# Patient Record
Sex: Male | Born: 1945 | Race: White | Hispanic: No | Marital: Single | Smoking: Current every day smoker
Health system: Southern US, Community
[De-identification: ages and names within clinical notes are randomized; demographics above are authoritative.]

## PROBLEM LIST (undated history)

## (undated) HISTORY — PX: NO PAST SURGERIES: SHX2092

---

## 2013-12-18 DIAGNOSIS — Z23 Encounter for immunization: Secondary | ICD-10-CM | POA: Diagnosis not present

## 2015-07-29 ENCOUNTER — Ambulatory Visit
Admission: EM | Admit: 2015-07-29 | Discharge: 2015-07-29 | Disposition: A | Discharge status: Home / Self Care | Payer: Medicare Other | Attending: Family Medicine | Admitting: Family Medicine

## 2015-07-29 DIAGNOSIS — J0100 Acute maxillary sinusitis, unspecified: Secondary | ICD-10-CM | POA: Diagnosis not present

## 2015-07-29 DIAGNOSIS — R062 Wheezing: Secondary | ICD-10-CM | POA: Diagnosis not present

## 2015-07-29 DIAGNOSIS — J42 Unspecified chronic bronchitis: Secondary | ICD-10-CM

## 2015-07-29 MED ORDER — ALBUTEROL SULFATE HFA 108 (90 BASE) MCG/ACT IN AERS: 2.0000 | INHALATION_SPRAY | Freq: Four times a day (QID) | RESPIRATORY_TRACT | Status: AC | PRN

## 2015-07-29 MED ORDER — DOXYCYCLINE HYCLATE 100 MG PO TABS: 100.0000 mg | ORAL_TABLET | Freq: Two times a day (BID) | ORAL | Status: AC

## 2015-07-29 MED ORDER — PREDNISONE 20 MG PO TABS: 20.0000 mg | ORAL_TABLET | Freq: Every day | ORAL | Status: AC

## 2015-07-29 NOTE — ED Notes (Signed)
Patient complains of sinus pain and pressure, with a cough. Patient states that he is a current smoker and is wheezing today. Patient states that symptoms started a week ago. Patient states that he is worse in the morning. Patient states that he is not currently on any inhalers.

## 2015-07-29 NOTE — ED Provider Notes (Signed)
CSN: [id number]     Arrival date & time 07/29/15  1230 History   First MD Initiated Contact with Patient 07/29/15 1252     Chief Complaint  Patient presents with  . Sinusitis   (Consider location/radiation/quality/duration/timing/severity/associated sxs/prior Treatment) Patient is a 70 y.o. male presenting with URI. The history is provided by the patient.  URI Presenting symptoms: congestion, cough and facial pain   Presenting symptoms: no fever   Severity:  Moderate Onset quality:  Sudden Duration:  2 weeks Timing:  Constant Progression:  Worsening Chronicity:  New Relieved by:  Nothing Ineffective treatments:  OTC medications Associated symptoms: headaches, sinus pain and wheezing   Risk factors: chronic respiratory disease (likely copd/emphysema due to >40 pack year h/o cigarrette smoking)     History reviewed. No pertinent past medical history. Past Surgical History  Procedure Laterality Date  . No past surgeries     Family History  Problem Relation Age of Onset  . Alzheimer's disease Father   . Emphysema Mother    Social History  Substance Use Topics  . Smoking status: Current Every Day Smoker -- 1.00 packs/day for 47 years    Types: Cigarettes  . Smokeless tobacco: None  . Alcohol Use: 0.0 oz/week    0 Standard drinks or equivalent per week     Comment: Drinks 6 beers daily, previously used to drink 12 beers daily    Review of Systems  Constitutional: Negative for fever.  HENT: Positive for congestion.   Respiratory: Positive for cough and wheezing.   Neurological: Positive for headaches.    Allergies  Review of patient's allergies indicates no known allergies.  Home Medications   Prior to Admission medications   Medication Sig Start Date End Date Taking? Authorizing Provider  albuterol (PROVENTIL HFA;VENTOLIN HFA) 108 (90 Base) MCG/ACT inhaler Inhale 2 puffs into the lungs every 6 (six) hours as needed for wheezing or shortness of breath. 07/29/15    Payton Mccallum, MD  doxycycline (VIBRA-TABS) 100 MG tablet Take 1 tablet (100 mg total) by mouth 2 (two) times daily. 07/29/15   Payton Mccallum, MD  predniSONE (DELTASONE) 20 MG tablet Take 1 tablet (20 mg total) by mouth daily. 07/29/15   Payton Mccallum, MD   Meds Ordered and Administered this Visit  Medications - No data to display  BP 115/78 mmHg  Pulse 88  Temp(Src) 98.2 F (36.8 C) (Oral)  Resp 16  Ht  (1.676 m)  Wt 105 lb (47.628 kg)  BMI 16.96 kg/m2  SpO2 98% No data found.   Physical Exam  Constitutional: He appears well-developed and well-nourished. No distress.  HENT:  Head: Normocephalic and atraumatic.  Right Ear: Tympanic membrane, external ear and ear canal normal.  Left Ear: Tympanic membrane, external ear and ear canal normal.  Nose: Right sinus exhibits maxillary sinus tenderness and frontal sinus tenderness. Left sinus exhibits maxillary sinus tenderness and frontal sinus tenderness.  Mouth/Throat: Uvula is midline, oropharynx is clear and moist and mucous membranes are normal. No oropharyngeal exudate or tonsillar abscesses.  Eyes: Conjunctivae and EOM are normal. Pupils are equal, round, and reactive to light. Right eye exhibits no discharge. Left eye exhibits no discharge. No scleral icterus.  Neck: Normal range of motion. Neck supple. No tracheal deviation present. No thyromegaly present.  Cardiovascular: Normal rate, regular rhythm and normal heart sounds.   Pulmonary/Chest: Effort normal and breath sounds normal. No stridor. No respiratory distress. He has no wheezes. He has no rales. He exhibits no tenderness.  Lymphadenopathy:    He has no cervical adenopathy.  Neurological: He is alert.  Skin: Skin is warm and dry. No rash noted. He is not diaphoretic.  Nursing note and vitals reviewed.   ED Course  Procedures (including critical care time)  Labs Review Labs Reviewed - No data to display  Imaging Review No results found.   Visual Acuity  Review  Right Eye Distance:   Left Eye Distance:   Bilateral Distance:    Right Eye Near:   Left Eye Near:    Bilateral Near:         MDM   1. Acute maxillary sinusitis, recurrence not specified   2. Chronic bronchitis, unspecified chronic bronchitis type (HCC)   3. Wheezing     Discharge Medication List as of 07/29/2015  1:20 PM    START taking these medications   Details  albuterol (PROVENTIL HFA;VENTOLIN HFA) 108 (90 Base) MCG/ACT inhaler Inhale 2 puffs into the lungs every 6 (six) hours as needed for wheezing or shortness of breath., Starting 07/29/2015, Until Discontinued, Normal    doxycycline (VIBRA-TABS) 100 MG tablet Take 1 tablet (100 mg total) by mouth 2 (two) times daily., Starting 07/29/2015, Until Discontinued, Normal    predniSONE (DELTASONE) 20 MG tablet Take 1 tablet (20 mg total) by mouth daily., Starting 07/29/2015, Until Discontinued, Normal       1. diagnosis reviewed with patient 2. rx as per orders above; reviewed possible side effects, interactions, risks and benefits  3. Recommend supportive treatment with otc decongestant prn 4. Recommend establish care with a PCP (states has not seen a PCP in about 3 years; info given) 5. Follow-up prn if symptoms worsen or don't improve   Payton Mccallumrlando Lekeisha Arenas, MD 07/29/15 1328

## 2015-07-29 NOTE — Discharge Instructions (Signed)
Chronic Bronchitis Chronic bronchitis is a lasting inflammation of the bronchial tubes, which are the tubes that carry air into your lungs. This is inflammation that occurs:   On most days of the week.   For at least three months at a time.   Over a period of two years in a row. When the bronchial tubes are inflamed, they start to produce mucus. The inflammation and buildup of mucus make it more difficult to breathe. Chronic bronchitis is usually a permanent problem and is one type of chronic obstructive pulmonary disease (COPD). People with chronic bronchitis are at greater risk for getting repeated colds, or respiratory infections. CAUSES  Chronic bronchitis most often occurs in people who have:  Long-standing, severe asthma.  A history of smoking.  Asthma and who also smoke. SIGNS AND SYMPTOMS  Chronic bronchitis may cause the following:   A cough that brings up mucus (productive cough).  Shortness of breath.  Early morning headache.  Wheezing.  Chest discomfort.   Recurring respiratory infections. DIAGNOSIS  Your health care provider may confirm the diagnosis by:  Taking your medical history.  Performing a physical exam.  Taking a chest X-ray.   Performing pulmonary function tests. TREATMENT  Treatment involves controlling symptoms with medicines, oxygen therapy, or making lifestyle changes, such as exercising and eating a healthy, well-balanced diet. Medicines could include:  Inhalers to improve air flow in and out of your lungs.  Antibiotics to treat bacterial infections, such as pneumonia, sinus infections, and acute bronchitis. As a preventative measure, your health care provider may recommend routine vaccinations for influenza and pneumonia. This is to prevent infection and hospitalization since you may be more at risk for these types of infections.  HOME CARE INSTRUCTIONS  Take medicines only as directed by your health care provider.   If you smoke  cigarettes, chew tobacco, or use electronic cigarettes, quit. If you need help quitting, ask your health care provider.  Avoid pollen, dust, animal dander, molds, smoke, and other things that cause shortness of breath or wheezing attacks.  Talk to your health care provider about possible exercise routines. Regular exercise is very important to help you feel better.  If you are prescribed oxygen use at home follow these guidelines:  Never smoke while using oxygen. Oxygen does not burn or explode, but flammable materials will burn faster in the presence of oxygen.  Keep a fire extinguisher close by. Let your fire department know that you have oxygen in your home.  Warn visitors not to smoke near you when you are using oxygen. Put up "no smoking" signs in your home where you most often use the oxygen.  Regularly test your smoke detectors at home to make sure they work. If you receive care in your home from a nurse or other health care provider, he or she may also check to make sure your smoke detectors work.  Ask your health care provider whether you would benefit from a pulmonary rehabilitation program.  Do not wait to get medical care if you have any concerning symptoms. Delays could cause permanent injury and may be life threatening. SEEK MEDICAL CARE IF:  You have increased coughing or shortness of breath or both.  You have muscle aches.  You have chest pain.  Your mucus gets thicker.  Your mucus changes from clear or white to yellow, green, gray, or bloody. SEEK IMMEDIATE MEDICAL CARE IF:  Your usual medicines do not stop your wheezing.   You have increased difficulty breathing.     You have any problems with the medicine you are taking, such as a rash, itching, swelling, or trouble breathing. MAKE SURE YOU:   Understand these instructions.  Will watch your condition.  Will get help right away if you are not doing well or get worse.   This information is not intended to  replace advice given to you by your health care provider. Make sure you discuss any questions you have with your health care provider.   Document Released: 11/13/2005 Document Revised: 02/16/2014 Document Reviewed: 03/06/2013 Elsevier Interactive Patient Education 2016 [employer]  

## 2016-01-09 DIAGNOSIS — Z23 Encounter for immunization: Secondary | ICD-10-CM | POA: Diagnosis not present

## 2016-09-08 ENCOUNTER — Emergency Department: Payer: Medicare Other

## 2016-09-08 ENCOUNTER — Encounter: Payer: Self-pay | Admitting: Emergency Medicine

## 2016-09-08 ENCOUNTER — Emergency Department
Admission: EM | Admit: 2016-09-08 | Discharge: 2016-09-08 | Disposition: A | Discharge status: Home / Self Care | Payer: Medicare Other | Attending: Emergency Medicine | Admitting: Emergency Medicine

## 2016-09-08 DIAGNOSIS — Y92012 Bathroom of single-family (private) house as the place of occurrence of the external cause: Secondary | ICD-10-CM | POA: Diagnosis not present

## 2016-09-08 DIAGNOSIS — F10229 Alcohol dependence with intoxication, unspecified: Secondary | ICD-10-CM | POA: Diagnosis not present

## 2016-09-08 DIAGNOSIS — Z79899 Other long term (current) drug therapy: Secondary | ICD-10-CM | POA: Insufficient documentation

## 2016-09-08 DIAGNOSIS — Y998 Other external cause status: Secondary | ICD-10-CM | POA: Insufficient documentation

## 2016-09-08 DIAGNOSIS — I714 Abdominal aortic aneurysm, without rupture, unspecified: Secondary | ICD-10-CM

## 2016-09-08 DIAGNOSIS — S22000A Wedge compression fracture of unspecified thoracic vertebra, initial encounter for closed fracture: Secondary | ICD-10-CM | POA: Insufficient documentation

## 2016-09-08 DIAGNOSIS — S299XXA Unspecified injury of thorax, initial encounter: Secondary | ICD-10-CM | POA: Diagnosis not present

## 2016-09-08 DIAGNOSIS — Z23 Encounter for immunization: Secondary | ICD-10-CM | POA: Diagnosis not present

## 2016-09-08 DIAGNOSIS — S0990XA Unspecified injury of head, initial encounter: Secondary | ICD-10-CM | POA: Diagnosis not present

## 2016-09-08 DIAGNOSIS — E871 Hypo-osmolality and hyponatremia: Secondary | ICD-10-CM | POA: Diagnosis not present

## 2016-09-08 DIAGNOSIS — F17210 Nicotine dependence, cigarettes, uncomplicated: Secondary | ICD-10-CM | POA: Insufficient documentation

## 2016-09-08 DIAGNOSIS — R51 Headache: Secondary | ICD-10-CM | POA: Diagnosis not present

## 2016-09-08 DIAGNOSIS — M545 Low back pain: Secondary | ICD-10-CM | POA: Diagnosis not present

## 2016-09-08 DIAGNOSIS — S3992XA Unspecified injury of lower back, initial encounter: Secondary | ICD-10-CM | POA: Diagnosis not present

## 2016-09-08 DIAGNOSIS — W19XXXA Unspecified fall, initial encounter: Secondary | ICD-10-CM | POA: Diagnosis not present

## 2016-09-08 DIAGNOSIS — Y9301 Activity, walking, marching and hiking: Secondary | ICD-10-CM | POA: Insufficient documentation

## 2016-09-08 DIAGNOSIS — F1020 Alcohol dependence, uncomplicated: Secondary | ICD-10-CM

## 2016-09-08 DIAGNOSIS — W01198A Fall on same level from slipping, tripping and stumbling with subsequent striking against other object, initial encounter: Secondary | ICD-10-CM | POA: Diagnosis not present

## 2016-09-08 LAB — CBC WITH DIFFERENTIAL/PLATELET
Basophils Absolute: 0.1 10*3/uL (ref 0–0.1)
Basophils Relative: 1 %
EOS PCT: 1 %
Eosinophils Absolute: 0.1 10*3/uL (ref 0–0.7)
HEMATOCRIT: 36.9 % — AB (ref 40.0–52.0)
Hemoglobin: 12.9 g/dL — ABNORMAL LOW (ref 13.0–18.0)
LYMPHS PCT: 11 %
Lymphs Abs: 1 10*3/uL (ref 1.0–3.6)
MCH: 34.2 pg — AB (ref 26.0–34.0)
MCHC: 35.1 g/dL (ref 32.0–36.0)
MCV: 97.5 fL (ref 80.0–100.0)
MONO ABS: 0.9 10*3/uL (ref 0.2–1.0)
MONOS PCT: 10 %
NEUTROS ABS: 6.9 10*3/uL — AB (ref 1.4–6.5)
Neutrophils Relative %: 77 %
PLATELETS: 277 10*3/uL (ref 150–440)
RBC: 3.78 MIL/uL — ABNORMAL LOW (ref 4.40–5.90)
RDW: 14 % (ref 11.5–14.5)
WBC: 8.9 10*3/uL (ref 3.8–10.6)

## 2016-09-08 LAB — BASIC METABOLIC PANEL
ANION GAP: 8 (ref 5–15)
BUN: 6 mg/dL (ref 6–20)
CALCIUM: 9 mg/dL (ref 8.9–10.3)
CO2: 26 mmol/L (ref 22–32)
CREATININE: 0.82 mg/dL (ref 0.61–1.24)
Chloride: 92 mmol/L — ABNORMAL LOW (ref 101–111)
GFR calc Af Amer: >60 mL/min (ref >60)
GFR calc non Af Amer: >60 mL/min (ref >60)
GLUCOSE: 81 mg/dL (ref 65–99)
Potassium: 4.1 mmol/L (ref 3.5–5.1)
Sodium: 126 mmol/L — ABNORMAL LOW (ref 135–145)

## 2016-09-08 LAB — URINALYSIS, COMPLETE (UACMP) WITH MICROSCOPIC
BACTERIA UA: NONE SEEN
Bilirubin Urine: NEGATIVE
Glucose, UA: 50 mg/dL — AB
HGB URINE DIPSTICK: NEGATIVE
Ketones, ur: 5 mg/dL — AB
Leukocytes, UA: NEGATIVE
NITRITE: NEGATIVE
PROTEIN: NEGATIVE mg/dL
RBC / HPF: NONE SEEN RBC/hpf (ref 0–5)
SQUAMOUS EPITHELIAL / LPF: NONE SEEN
Specific Gravity, Urine: 1.008 (ref 1.005–1.030)
pH: 6 (ref 5.0–8.0)

## 2016-09-08 LAB — CK: Total CK: 174 U/L (ref 49–397)

## 2016-09-08 MED ORDER — TETANUS-DIPHTH-ACELL PERTUSSIS 5-2.5-18.5 LF-MCG/0.5 IM SUSP
0.5000 mL | Freq: Once | INTRAMUSCULAR | Status: AC
  Administered 2016-09-08: 0.5 mL via INTRAMUSCULAR
  Filled 2016-09-08: qty 0.5

## 2016-09-08 MED ORDER — ONDANSETRON HCL 4 MG/2ML IJ SOLN
4.0000 mg | Freq: Once | INTRAMUSCULAR | Status: AC
  Administered 2016-09-08: 4 mg via INTRAVENOUS
  Filled 2016-09-08: qty 2

## 2016-09-08 MED ORDER — BACITRACIN ZINC 500 UNIT/GM EX OINT
TOPICAL_OINTMENT | CUTANEOUS | Status: AC
  Filled 2016-09-08: qty 1.8

## 2016-09-08 MED ORDER — SODIUM CHLORIDE 0.9 % IV BOLUS (SEPSIS)
1000.0000 mL | Freq: Once | INTRAVENOUS | Status: AC
  Administered 2016-09-08: 1000 mL via INTRAVENOUS

## 2016-09-08 MED ORDER — MORPHINE SULFATE (PF) 4 MG/ML IV SOLN
4.0000 mg | Freq: Once | INTRAVENOUS | Status: AC
  Administered 2016-09-08: 4 mg via INTRAVENOUS
  Filled 2016-09-08: qty 1

## 2016-09-08 NOTE — ED Notes (Signed)
Pt wheeled to waiting room to await his ride.

## 2016-09-08 NOTE — ED Triage Notes (Signed)
Pt via ems from home after falling last night around 2200. Pt states that he tried to catch himself and fell onto the bathtub, resulting in skin tears to right elbow and bruising to left arm. Pt crawled to bedroom and couldn't reach his phone so he pulled his covers down and slept in the floor. Friend found him in the floor this am. Pt does not know if he passed out. States that he had 3-4 drinks last night, which is typical for him. He c/o pain to lower back. Denies significant medical hx. Pt alert & oriented.

## 2016-09-08 NOTE — ED Provider Notes (Addendum)
[facility] Emergency Department Provider Note  ____________________________________________   First MD Initiated Contact with Patient 09/08/16 1133     (approximate)  I have reviewed the triage vital signs and the nursing notes.   HISTORY  Chief Complaint Fall   HPI Darin Nolan is a 71 y.o. male with a history of alcoholism was presenting to the emergency department after a fall. He says that he was drinking last night and then slipped and fell backward in his bathtub. He is unsure if he hit his head and sustained a skin tear to his right medial arm as well as an impact to his lower back. He says that his pain was so great in the low back after the fall that he was unable to stand and needed to pull himself in the bathtub into his living room where he was found this morning by his roommate. He says the pain is improving now and is only moderate to the low back but is still unable to handle it without difficulty. He is denying any headache at this time. Says that he drinks about 5 days a week and drinks about 6 beers each day. He is unsure of the date of his last tetanus shot.   History reviewed. No pertinent past medical history.  There are no active problems to display for this patient.   Past Surgical History:  Procedure Laterality Date  . NO PAST SURGERIES      Prior to Admission medications   Medication Sig Start Date End Date Taking? Authorizing Provider  albuterol (PROVENTIL HFA;VENTOLIN HFA) 108 (90 Base) MCG/ACT inhaler Inhale 2 puffs into the lungs every 6 (six) hours as needed for wheezing or shortness of breath. 07/29/15   Payton Mccallumonty, [place], MD  doxycycline (VIBRA-TABS) 100 MG tablet Take 1 tablet (100 mg total) by mouth 2 (two) times daily. 07/29/15   Payton Mccallumonty, [place], MD  predniSONE (DELTASONE) 20 MG tablet Take 1 tablet (20 mg total) by mouth daily. 07/29/15   Payton Mccallumonty, [place], MD    Allergies Patient has no known allergies.  Family  History  Problem Relation Age of Onset  . Emphysema Mother   . Alzheimer's disease Father     Social History Social History  Substance Use Topics  . Smoking status: Current Every Day Smoker    Packs/day: 1.00    Years: 47.00    Types: Cigarettes  . Smokeless tobacco: Never Used  . Alcohol use 0.0 oz/week     Comment: Drinks 3-4 drinks daily, previously used to drink 12 beers daily    Review of Systems  Constitutional: No fever/chills Eyes: No visual changes. ENT: No sore throat. Cardiovascular: Denies chest pain. Respiratory: Denies shortness of breath. Gastrointestinal: No abdominal pain.  No nausea, no vomiting.  No diarrhea.  No constipation. Genitourinary: Negative for dysuria. Musculoskeletal: as above Skin: as above Neurological: Negative for headaches, focal weakness or numbness.   ____________________________________________   PHYSICAL EXAM:  VITAL SIGNS: ED Triage Vitals  Enc Vitals Group     BP 09/08/16 1118 133/84     Pulse Rate 09/08/16 1118 67     Resp 09/08/16 1118 18     Temp 09/08/16 1118 98.3 F (36.8 C)     Temp Source 09/08/16 1118 Oral     SpO2 09/08/16 1118 99 %     Weight 09/08/16 1119 98 lb (44.5 kg)     Height 09/08/16 1119 5\' 6"  (1.676 m)     Head Circumference --  Peak Flow --      Pain Score 09/08/16 1118 8     Pain Loc --      Pain Edu? --      Excl. in GC? --     Constitutional: Alert and oriented. in no acute distress. Eyes: Conjunctivae are normal.  Head: Atraumatic. Nose: No congestion/rhinnorhea. Mouth/Throat: Mucous membranes are moist.  Neck: No stridor.   Cardiovascular: Normal rate, regular rhythm. Grossly normal heart sounds.   Respiratory: Normal respiratory effort.  No retractions. Lungs CTAB. Gastrointestinal: Soft and nontender. No distention. No CVA tenderness. Musculoskeletal: No lower extremity tenderness nor edema.  No joint effusions.No tenderness to the bilateral hips. There is no shortening or  deformity of the bilateral extremities. The patient is 5 out of 5 strength bilateral lower extremities with intact and equal dorsalis pedis pulses. He has sensation to light touch the bilateral extremities. No bruising noted to the low back. No deformity or step-off or tenderness palpation on my initial exam. Neurologic:  Normal speech and language. No gross focal neurologic deficits are appreciated. Skin:  S 3 x 5 cm superficial skin tear to the right medial arm without any active bleeding. No induration or pus. Ranges the elbow well. The skin tears at the distal portion of the medial arm. Psychiatric: Mood and affect are normal. Speech and behavior are normal.  ____________________________________________   LABS (all labs ordered are listed, but only abnormal results are displayed)  Labs Reviewed  CBC WITH DIFFERENTIAL/PLATELET - Abnormal; Notable for the following:       Result Value   RBC 3.78 (*)    Hemoglobin 12.9 (*)    HCT 36.9 (*)    MCH 34.2 (*)    Neutro Abs 6.9 (*)    All other components within normal limits  BASIC METABOLIC PANEL - Abnormal; Notable for the following:    Sodium 126 (*)    Chloride 92 (*)    All other components within normal limits  URINALYSIS, COMPLETE (UACMP) WITH MICROSCOPIC - Abnormal; Notable for the following:    Color, Urine YELLOW (*)    APPearance CLEAR (*)    Glucose, UA 50 (*)    Ketones, ur 5 (*)    All other components within normal limits  CK   ____________________________________________  EKG  ED ECG REPORT I, Arelia Longest, the attending physician, personally viewed and interpreted this ECG.   Date: 09/08/2016  EKG Time: 1134  Rate: 61  Rhythm: normal sinus rhythm  Axis: Left axis  Intervals:none  ST&T Change: No ST segment elevation or depression. Single T-wave inversion in aVL.  ____________________________________________  RADIOLOGY  CT head without acute disease. Lumbar spine films with an age indeterminate  compression fracture the upper endplate of T11 to about 30%. Also noted is an abdominal aortic aneurysm measuring about 3.3 cm in diameter. ____________________________________________   PROCEDURES  Procedure(s) performed: None  Procedures  Critical Care performed: No  ____________________________________________   INITIAL IMPRESSION / ASSESSMENT AND PLAN / ED COURSE  Pertinent labs & imaging results that were available during my care of the patient were reviewed by me and considered in my medical decision making (see chart for details).  ----------------------------------------- 2:30 PM on 09/08/2016 -----------------------------------------  Patient at this time is able to ambulate without any assistance. He is also able to sit up and change positions well and without any signs of discomfort. He is not tender over the T11 region. We also discussed his low sodium which I  feel is likely related to his drinking and the incidental finding of the aortic aneurysm on his x-ray. We discussed the need for follow-up with a primary care doctor because of monitoring of his sodium, his back pain as well as the aneurysm. The patient is denying any abdominal pain. I feel that his back pain is directly related to trauma from the fall. However, unlikely to be related to the age-indeterminate compression fracture. The patient is understanding of this plan and willing to comply per will be discharged home.      ____________________________________________   FINAL CLINICAL IMPRESSION(S) / ED DIAGNOSES  Fall. Back pain. Hypernatremia. Abdominal aortic aneurysm.    NEW MEDICATIONS STARTED DURING THIS VISIT:  New Prescriptions   No medications on file     Note:  This document was prepared using Dragon voice recognition software and may include unintentional dictation errors.     Myrna BlazerSchaevitz, Reegan Bouffard Matthew, MD 09/08/16 1433    Darin Nolan, Myra Rudeavid Matthew, MD 09/08/16 [phone]

## 2016-09-08 NOTE — ED Notes (Signed)
Pt discharged home after verbalizing understanding of discharge instructions; nad noted. 

## 2016-10-21 DIAGNOSIS — R6889 Other general symptoms and signs: Secondary | ICD-10-CM | POA: Diagnosis not present

## 2016-10-21 DIAGNOSIS — Z1159 Encounter for screening for other viral diseases: Secondary | ICD-10-CM | POA: Diagnosis not present

## 2016-10-23 DIAGNOSIS — Z1211 Encounter for screening for malignant neoplasm of colon: Secondary | ICD-10-CM | POA: Diagnosis not present

## 2016-11-24 DIAGNOSIS — Z1159 Encounter for screening for other viral diseases: Secondary | ICD-10-CM | POA: Diagnosis not present

## 2016-11-24 DIAGNOSIS — Z0000 Encounter for general adult medical examination without abnormal findings: Secondary | ICD-10-CM | POA: Diagnosis not present

## 2016-11-24 DIAGNOSIS — Z23 Encounter for immunization: Secondary | ICD-10-CM | POA: Diagnosis not present

## 2016-11-24 DIAGNOSIS — Z1389 Encounter for screening for other disorder: Secondary | ICD-10-CM | POA: Diagnosis not present

## 2017-11-08 DIAGNOSIS — Z1211 Encounter for screening for malignant neoplasm of colon: Secondary | ICD-10-CM | POA: Diagnosis not present

## 2018-11-28 ENCOUNTER — Telehealth: Payer: Self-pay | Admitting: *Deleted

## 2018-11-28 ENCOUNTER — Other Ambulatory Visit: Payer: Self-pay | Admitting: Family Medicine

## 2018-11-28 DIAGNOSIS — I714 Abdominal aortic aneurysm, without rupture, unspecified: Secondary | ICD-10-CM

## 2018-11-28 DIAGNOSIS — Z8679 Personal history of other diseases of the circulatory system: Secondary | ICD-10-CM

## 2018-11-28 DIAGNOSIS — I713 Abdominal aortic aneurysm, ruptured, unspecified: Secondary | ICD-10-CM

## 2018-11-28 NOTE — Telephone Encounter (Signed)
Received referral for low dose lung cancer screening CT scan. Message left at phone number listed in EMR for patient to call me back to facilitate scheduling scan.  

## 2018-11-29 ENCOUNTER — Telehealth: Payer: Self-pay | Admitting: *Deleted

## 2018-11-29 ENCOUNTER — Other Ambulatory Visit: Payer: Self-pay | Admitting: Family Medicine

## 2018-11-29 DIAGNOSIS — F1020 Alcohol dependence, uncomplicated: Secondary | ICD-10-CM

## 2018-11-29 DIAGNOSIS — I714 Abdominal aortic aneurysm, without rupture, unspecified: Secondary | ICD-10-CM

## 2018-11-29 NOTE — Telephone Encounter (Signed)
Received referral for low dose lung cancer screening CT scan. Message left at phone number listed in EMR for patient to call me back to facilitate scheduling scan.  

## 2018-11-30 ENCOUNTER — Telehealth: Payer: Self-pay | Admitting: *Deleted

## 2018-11-30 DIAGNOSIS — Z122 Encounter for screening for malignant neoplasm of respiratory organs: Secondary | ICD-10-CM

## 2018-11-30 DIAGNOSIS — Z87891 Personal history of nicotine dependence: Secondary | ICD-10-CM

## 2018-11-30 NOTE — Telephone Encounter (Signed)
Received referral for initial lung cancer screening scan. Contacted patient and obtained smoking history,(current, 51 pack year) as well as answering questions related to screening process. Patient denies signs of lung cancer such as weight loss or hemoptysis. Patient denies comorbidity that would prevent curative treatment if lung cancer were found. Patient is scheduled for shared decision making visit and CT scan on 12/13/18 at 2pm.

## 2018-11-30 NOTE — Telephone Encounter (Signed)
Received referral for low dose lung cancer screening CT scan. Message left at phone number listed in EMR for patient to call me back to facilitate scheduling scan.  

## 2018-12-12 ENCOUNTER — Encounter: Payer: Self-pay | Admitting: Nurse Practitioner

## 2018-12-13 ENCOUNTER — Inpatient Hospital Stay: Payer: Medicare Other | Attending: Oncology | Admitting: Nurse Practitioner

## 2018-12-13 ENCOUNTER — Other Ambulatory Visit: Payer: Self-pay

## 2018-12-13 ENCOUNTER — Ambulatory Visit
Admission: RE | Admit: 2018-12-13 | Discharge: 2018-12-13 | Disposition: A | Discharge status: Home / Self Care | Payer: Medicare Other | Source: Ambulatory Visit | Attending: Oncology | Admitting: Oncology

## 2018-12-13 DIAGNOSIS — Z122 Encounter for screening for malignant neoplasm of respiratory organs: Secondary | ICD-10-CM | POA: Insufficient documentation

## 2018-12-13 DIAGNOSIS — Z87891 Personal history of nicotine dependence: Secondary | ICD-10-CM | POA: Insufficient documentation

## 2018-12-13 NOTE — Progress Notes (Signed)
Virtual Visit via Video Enabled Telemedicine Note   I connected with Darin Nolan on 12/13/18 at 2:00 PM EST by video enabled telemedicine visit and verified that I am speaking with the correct person using two identifiers.   I discussed the limitations, risks, security and privacy concerns of performing an evaluation and management service by telemedicine and the availability of in-person appointments. I also discussed with the patient that there may be a patient responsible charge related to this service. The patient expressed understanding and agreed to proceed.   Other persons participating in the visit and their role in the encounter: Burgess Estelle, RN- checking in patient & navigation  Patient's location: [place]  Provider's location: Clinic  Chief Complaint: Low Dose CT Screening  Patient agreed to evaluation by telemedicine to discuss shared decision making for consideration of low dose CT lung cancer screening.    In accordance with CMS guidelines, patient has met eligibility criteria including age, absence of signs or symptoms of lung cancer.  Social History   Tobacco Use  . Smoking status: Current Every Day Smoker    Packs/day: 1.00    Years: 51.00    Pack years: 51.00    Types: Cigarettes  . Smokeless tobacco: Never Used  Substance Use Topics  . Alcohol use: Yes    Alcohol/week: 0.0 standard drinks    Comment: Drinks 3-4 drinks daily, previously used to drink 12 beers daily     A shared decision-making session was conducted prior to the performance of CT scan. This includes one or more decision aids, includes benefits and harms of screening, follow-up diagnostic testing, over-diagnosis, false positive rate, and total radiation exposure.   Counseling on the importance of adherence to annual lung cancer LDCT screening, impact of co-morbidities, and ability or willingness to undergo diagnosis and treatment is imperative for compliance of the program.    Counseling on the importance of continued smoking cessation for former smokers; the importance of smoking cessation for current smokers, and information about tobacco cessation interventions have been given to patient including [employer] and [employer] Quit  programs.   Written order for lung cancer screening with LDCT has been given to the patient and any and all questions have been answered to the best of my abilities.    Yearly follow up will be coordinated by Burgess Estelle, Thoracic Navigator.  I discussed the assessment and treatment plan with the patient. The patient was provided an opportunity to ask questions and all were answered. The patient agreed with the plan and demonstrated an understanding of the instructions.   The patient was advised to call back or seek an in-person evaluation if the symptoms worsen or if the condition fails to improve as anticipated.   I provided 15 minutes of face-to-face video visit time during this encounter, and > 50% was spent counseling as documented under my assessment & plan.   Beckey Rutter, DNP, AGNP-C [facility]

## 2018-12-15 ENCOUNTER — Telehealth: Payer: Self-pay | Admitting: *Deleted

## 2018-12-15 NOTE — Telephone Encounter (Signed)
Notified patient of LDCT lung cancer screening program results with recommendation for 6 month follow up imaging. Also notified of incidental findings noted below and is encouraged to discuss further with PCP who will receive a copy of this note and/or the CT report. Patient verbalizes understanding.   IMPRESSION: Lung-RADS 3, probably benign findings. Short-term follow-up in 6 months is recommended with repeat low-dose chest CT without contrast (please use the following order, "CT CHEST LCS NODULE FOLLOW-UP W/O CM").  Right upper lung predominant peribronchovascular nodularity, measuring up to 7.0 mm, favoring mild infection.  Aortic Atherosclerosis (ICD10-I70.0) and Emphysema (ICD10-J43.9).

## 2019-05-29 ENCOUNTER — Telehealth: Payer: Self-pay

## 2019-05-29 NOTE — Telephone Encounter (Signed)
Message left notifying patient that it is time to schedule the low dose lung cancer screening CT scan.  Instructed patient to return call to Shawn Perkins at [phone] to verify information prior to CT scan being scheduled.    

## 2019-06-15 ENCOUNTER — Telehealth: Payer: Self-pay

## 2019-06-15 NOTE — Telephone Encounter (Signed)
I contacted patient to scheduled his lung screening CT scan.  I left a message for patient instructing him to contact Glenna Fellows, lung navigator to schedule appointment/scan.

## 2019-06-22 ENCOUNTER — Telehealth: Payer: Self-pay

## 2019-06-22 NOTE — Telephone Encounter (Signed)
Message left notifying patient that it is time to schedule the low dose lung cancer screening CT scan.  Instructed patient to return call to Shawn Perkins at [phone] to verify information prior to CT scan being scheduled.    

## 2019-07-25 ENCOUNTER — Telehealth: Payer: Self-pay | Admitting: *Deleted

## 2019-07-25 NOTE — Telephone Encounter (Signed)
(  07/25/19) Left message for pt to notify them that it is time to schedule annual low dose lung cancer screening CT scan. Instructed patient to call back to verify information prior to the scan being scheduled SR[facility]     

## 2019-11-07 ENCOUNTER — Encounter: Payer: Self-pay | Admitting: *Deleted

## 2019-12-04 ENCOUNTER — Other Ambulatory Visit: Payer: Self-pay | Admitting: Family Medicine

## 2019-12-04 DIAGNOSIS — I714 Abdominal aortic aneurysm, without rupture, unspecified: Secondary | ICD-10-CM

## 2019-12-14 ENCOUNTER — Other Ambulatory Visit: Payer: Self-pay | Admitting: Family Medicine

## 2019-12-14 DIAGNOSIS — I714 Abdominal aortic aneurysm, without rupture, unspecified: Secondary | ICD-10-CM

## 2019-12-21 ENCOUNTER — Telehealth: Payer: Self-pay | Admitting: *Deleted

## 2019-12-21 DIAGNOSIS — Z122 Encounter for screening for malignant neoplasm of respiratory organs: Secondary | ICD-10-CM

## 2019-12-21 DIAGNOSIS — Z87891 Personal history of nicotine dependence: Secondary | ICD-10-CM

## 2019-12-21 NOTE — Telephone Encounter (Signed)
Attempted to contact and schedule lung screening scan. Message left for patient to call back to schedule. 

## 2019-12-21 NOTE — Telephone Encounter (Signed)
Contacted and scheduled for lung screening scan.

## 2019-12-21 NOTE — Addendum Note (Signed)
Addended by: Jonne Ply on: 12/21/2019 01:48 PM   Modules accepted: Orders

## 2019-12-29 ENCOUNTER — Other Ambulatory Visit: Payer: Self-pay

## 2019-12-29 ENCOUNTER — Ambulatory Visit
Admission: RE | Admit: 2019-12-29 | Discharge: 2019-12-29 | Disposition: A | Discharge status: Home / Self Care | Payer: Medicare Other | Source: Ambulatory Visit | Attending: Nurse Practitioner | Admitting: Nurse Practitioner

## 2019-12-29 DIAGNOSIS — Z122 Encounter for screening for malignant neoplasm of respiratory organs: Secondary | ICD-10-CM | POA: Diagnosis present

## 2019-12-29 DIAGNOSIS — Z87891 Personal history of nicotine dependence: Secondary | ICD-10-CM | POA: Insufficient documentation

## 2020-01-09 ENCOUNTER — Telehealth: Payer: Self-pay | Admitting: *Deleted

## 2020-01-09 NOTE — Telephone Encounter (Signed)
Voicemail left in attempt to review lung screening scan results with patient.   IMPRESSION: 1. Lung-RADS 3S, probably benign findings. Short-term follow-up in 6 months is recommended with repeat low-dose chest CT without contrast (please use the following order, "CT CHEST LCS NODULE FOLLOW-UP W/O CM"). 2. The "S" modifier above refers to potentially clinically significant non lung cancer related findings. Specifically, there is aortic atherosclerosis, in addition to left main and 3 vessel coronary artery disease. Please note that although the presence of coronary artery calcium documents the presence of coronary artery disease, the severity of this disease and any potential stenosis cannot be assessed on this non-gated CT examination. Assessment for potential risk factor modification, dietary therapy or pharmacologic therapy may be warranted, if clinically indicated. 3. Mild diffuse bronchial wall thickening with mild centrilobular and paraseptal emphysema; imaging findings suggestive of underlying COPD.  Aortic Atherosclerosis (ICD10-I70.0) and Emphysema (ICD10-J43.9).

## 2020-01-09 NOTE — Telephone Encounter (Signed)
Notified patient of LDCT lung cancer screening program results with recommendation for 6 month follow up imaging. Also notified of incidental findings noted below and is encouraged to discuss further with PCP who will receive a copy of this note and/or the CT report. Patient verbalizes understanding.   IMPRESSION: 1. Lung-RADS 3S, probably benign findings. Short-term follow-up in 6 months is recommended with repeat low-dose chest CT without contrast (please use the following order, "CT CHEST LCS NODULE FOLLOW-UP W/O CM"). 2. The "S" modifier above refers to potentially clinically significant non lung cancer related findings. Specifically, there is aortic atherosclerosis, in addition to left main and 3 vessel coronary artery disease. Please note that although the presence of coronary artery calcium documents the presence of coronary artery disease, the severity of this disease and any potential stenosis cannot be assessed on this non-gated CT examination. Assessment for potential risk factor modification, dietary therapy or pharmacologic therapy may be warranted, if clinically indicated. 3. Mild diffuse bronchial wall thickening with mild centrilobular and paraseptal emphysema; imaging findings suggestive of underlying COPD.  Aortic Atherosclerosis (ICD10-I70.0) and Emphysema (ICD10-J43.9).  

## 2020-06-26 ENCOUNTER — Telehealth: Payer: Self-pay

## 2020-06-26 NOTE — Telephone Encounter (Signed)
Left message for patient to notify them that it is time to schedule 6 month follow up lung cancer screening CT scan. Instructed patient to call back [phone]) to verify information and schedule.

## 2020-08-19 ENCOUNTER — Telehealth: Payer: Self-pay | Admitting: *Deleted

## 2020-08-19 NOTE — Telephone Encounter (Signed)
Left message for patient to notify him that it is time to schedule the6 month f/u low dose lung cancer screening CT scan. Instructed patient to call back ([phone]) to verify information and schedule.  

## 2021-01-07 ENCOUNTER — Other Ambulatory Visit: Payer: Self-pay | Admitting: Nurse Practitioner

## 2021-01-07 DIAGNOSIS — I719 Aortic aneurysm of unspecified site, without rupture: Secondary | ICD-10-CM

## 2021-01-07 DIAGNOSIS — J449 Chronic obstructive pulmonary disease, unspecified: Secondary | ICD-10-CM

## 2021-01-07 DIAGNOSIS — F1020 Alcohol dependence, uncomplicated: Secondary | ICD-10-CM

## 2021-01-07 DIAGNOSIS — Z87891 Personal history of nicotine dependence: Secondary | ICD-10-CM

## 2021-01-07 DIAGNOSIS — R6889 Other general symptoms and signs: Secondary | ICD-10-CM

## 2021-01-10 ENCOUNTER — Other Ambulatory Visit: Payer: Self-pay | Admitting: Family Medicine

## 2021-01-29 ENCOUNTER — Telehealth: Payer: Self-pay | Admitting: Acute Care

## 2021-01-29 NOTE — Telephone Encounter (Signed)
Left message for pt to call back to schedule f/u lung screening Ct. Call back number given.

## 2021-02-28 NOTE — Telephone Encounter (Signed)
Left another message for pt to call back to schedule f/u lung cancer screening CT.

## 2021-03-13 ENCOUNTER — Other Ambulatory Visit: Payer: Self-pay

## 2021-03-13 DIAGNOSIS — Z87891 Personal history of nicotine dependence: Secondary | ICD-10-CM

## 2021-03-13 DIAGNOSIS — F17210 Nicotine dependence, cigarettes, uncomplicated: Secondary | ICD-10-CM

## 2021-03-20 ENCOUNTER — Other Ambulatory Visit: Payer: Self-pay

## 2021-03-20 ENCOUNTER — Ambulatory Visit
Admission: RE | Admit: 2021-03-20 | Discharge: 2021-03-20 | Disposition: A | Discharge status: Home / Self Care | Payer: Medicare Other | Source: Ambulatory Visit | Attending: Acute Care | Admitting: Acute Care

## 2021-03-20 DIAGNOSIS — F17210 Nicotine dependence, cigarettes, uncomplicated: Secondary | ICD-10-CM

## 2021-03-20 DIAGNOSIS — Z87891 Personal history of nicotine dependence: Secondary | ICD-10-CM | POA: Diagnosis present

## 2021-04-17 ENCOUNTER — Telehealth: Payer: Self-pay | Admitting: Acute Care

## 2021-04-17 DIAGNOSIS — Z87891 Personal history of nicotine dependence: Secondary | ICD-10-CM

## 2021-04-17 DIAGNOSIS — F17210 Nicotine dependence, cigarettes, uncomplicated: Secondary | ICD-10-CM

## 2021-04-17 NOTE — Telephone Encounter (Signed)
I have attempted to call the patient with the results of his low-dose CT.  There was no answer.  His scan was read as a lung RADS 2, or behavior recommendation is for 40-month follow-up.  The lung cancer screening program will schedule precertify and manage this. ?There was notation of questionable aspiration based on imaging findings.  I have called the patient's primary care physician he is [facility] by the [facility] in [place].  I spoke with the triage nurse, let her know that there was concern on imaging for aspiration and that radiology recommended a clinical evaluation with follow-up swallow study if eval is suspicious for aspiration. ?Deanna Artis, the triage nurse, was going to send a note to the provider who sees Mr. Brafford on a regular basis.  Apparently they have been trying to get in touch with the patient and have not been successful doing so.  While on the phone with Deanna Artis we found a number for his sister and she will try to get in touch with her to schedule an evaluation of patient's swallow. ?Denise please send a letter to the patient's address with the results of his scan.  Let him know that I have touched base with his primary care doctor and that they will be reaching out to him to get him scheduled for a clinical evaluation of his swallow, and to ask him if he gets choked while eating or drinking. ?Placed order for 76-month follow-up screening scan, and fax results to PCP. ?Thanks so much ?

## 2021-04-17 NOTE — Telephone Encounter (Signed)
I have called the patient with the results of the low dose CT Chest.I explained his scan was read as a Lung RADS 2: nodules that are benign in appearance and behavior with a very low likelihood of becoming a clinically active cancer due to size or lack of growth. Recommendation per radiology is for a repeat LDCT in 12 months. I told him we will call him when it is time for his annual scan in 03/2022.  ?I also asked him if he has been having any issues with getting choked on his food or deink when eating or drinking. He said he has had no issues with choking. I told him I did call his PCP to let them know the scan results, and to have them  better evaluate him for any aspiration.He verbalized understanding. He understands his PCP's office may be reaching out to him for follow up. ?Angelique Blonder, please place order for 03/2022 follow up, and fax results to PCP. Thanks so much ?

## 2021-04-17 NOTE — Telephone Encounter (Signed)
CT results faxed to PCP. Order placed for 12 mth f/u lung screening CT scan.  ?

## 2021-04-24 ENCOUNTER — Telehealth: Payer: Self-pay

## 2021-04-24 NOTE — Telephone Encounter (Signed)
Called to make new patient appointment for weight loss ?

## 2021-04-25 ENCOUNTER — Telehealth: Payer: Self-pay

## 2021-04-25 NOTE — Telephone Encounter (Signed)
Patient does not want appointment he states he is fine ?

## 2021-04-25 NOTE — Telephone Encounter (Signed)
CALLED PATIENT NO ANSWER LEFT VOICEMAIL FOR A CALL BACK °Letter sent °

## 2022-03-20 ENCOUNTER — Ambulatory Visit: Payer: Medicare Other | Attending: Acute Care

## 2022-05-28 IMAGING — CT CT CHEST LUNG CANCER SCREENING LOW DOSE W/O CM
2 of 5 series · 15 of 40 positions shown, 18 images · non-contrast
Comparison: 12/29/2019

CLINICAL DATA: Fifty-two pack-year smoking history. Current smoker.
History of mesothelioma.



[Series 3: lung 1.00 · axial · 0.63mm/px · z∈[-1264,-943]mm · 12 of 355 slices shown, 15 images]
[im 17/355  mediastinal]
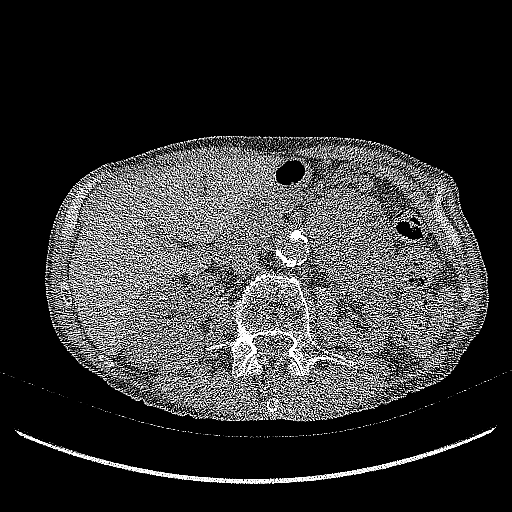
[im 17/355  lung]
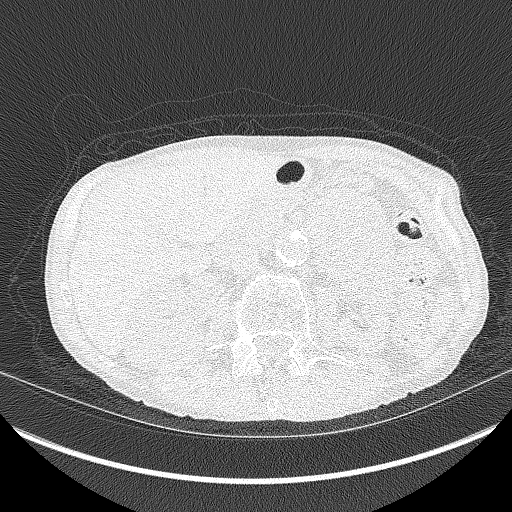
[im 49/355  lung]
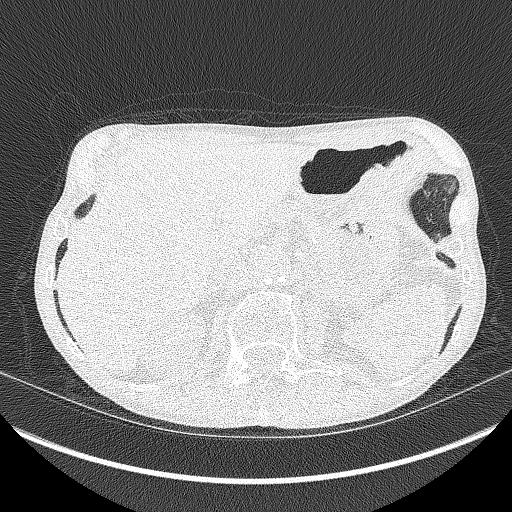
[im 81/355  lung]
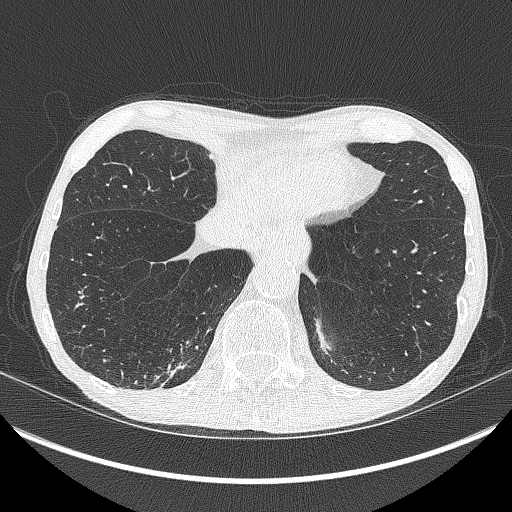
[im 113/355  lung]
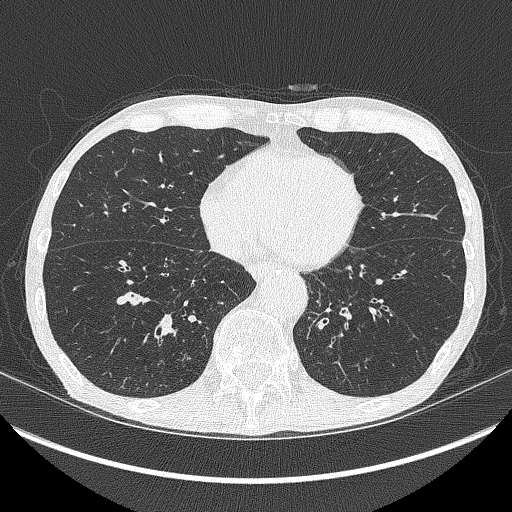
[im 129/355  mediastinal]
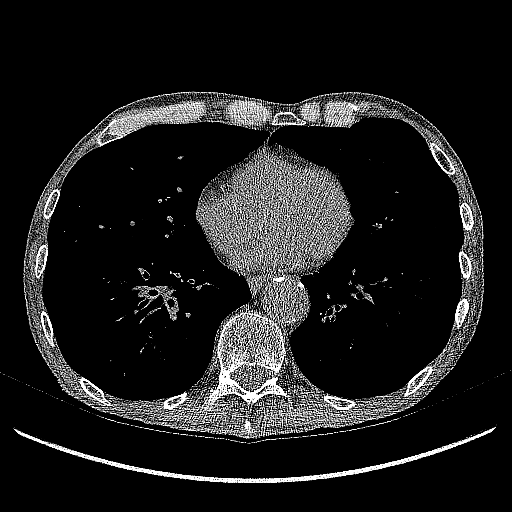
[im 129/355  lung]
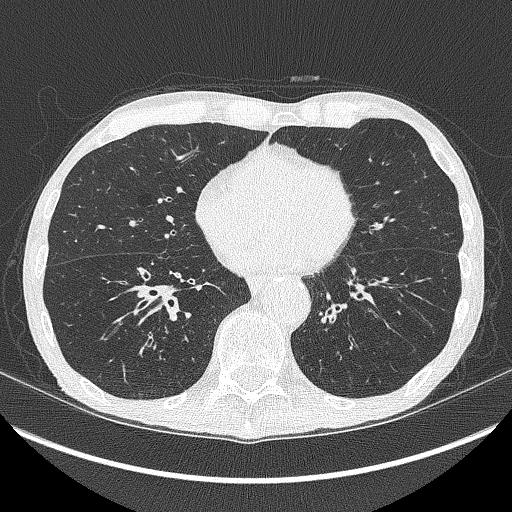
[im 161/355  lung]
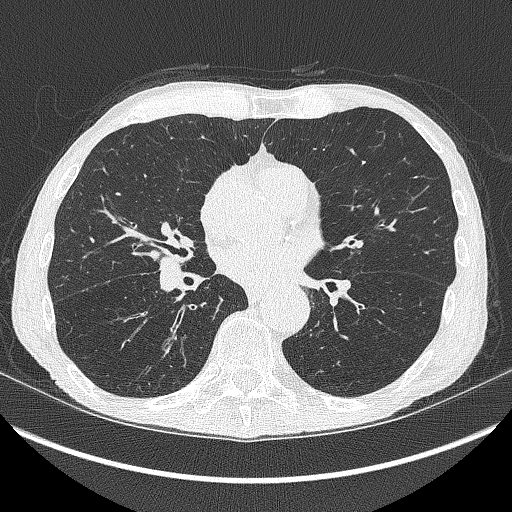
[im 194/355  lung]
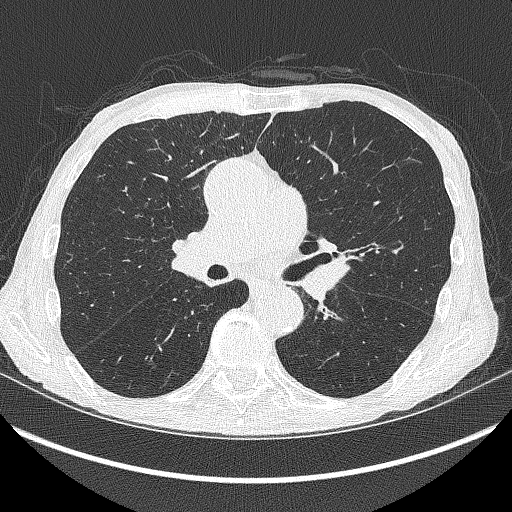
[im 226/355  lung]
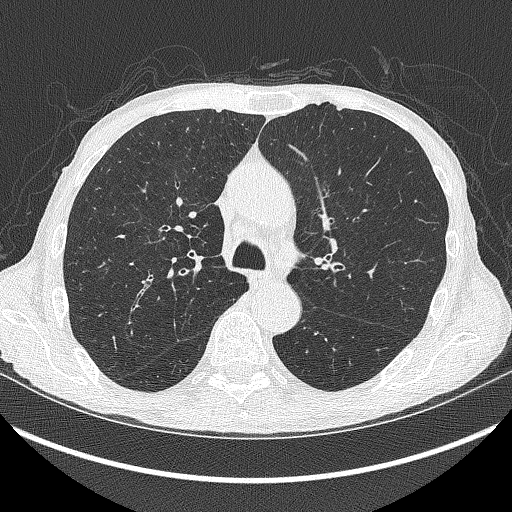
[im 242/355  mediastinal]
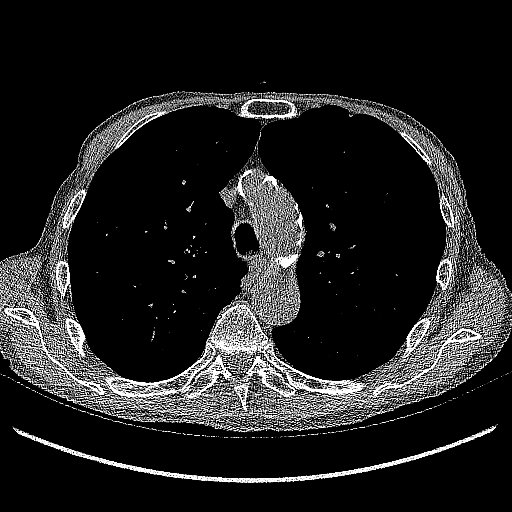
[im 242/355  lung]
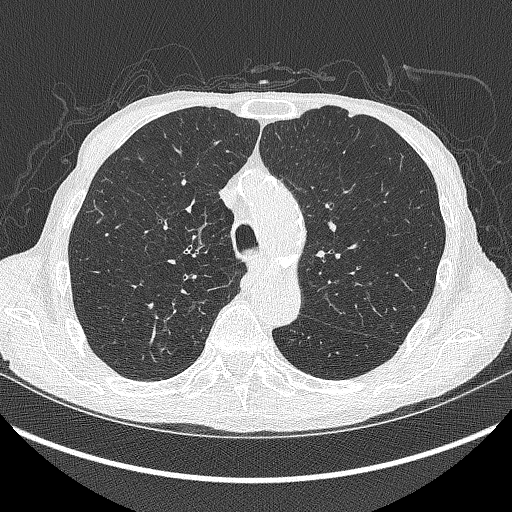
[im 274/355  lung]
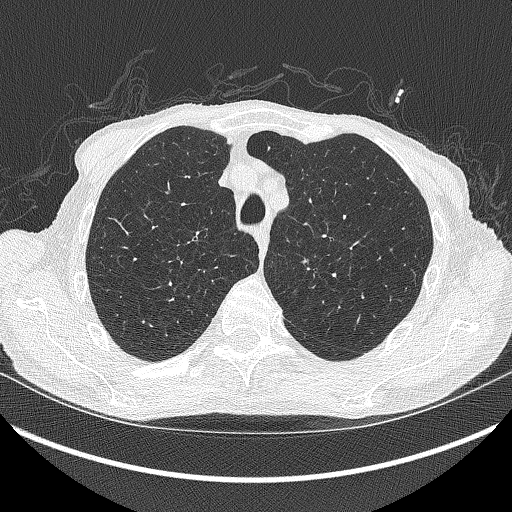
[im 306/355  lung]
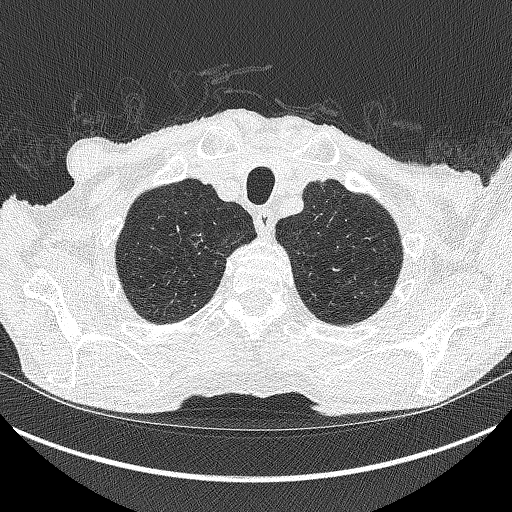
[im 338/355  lung]
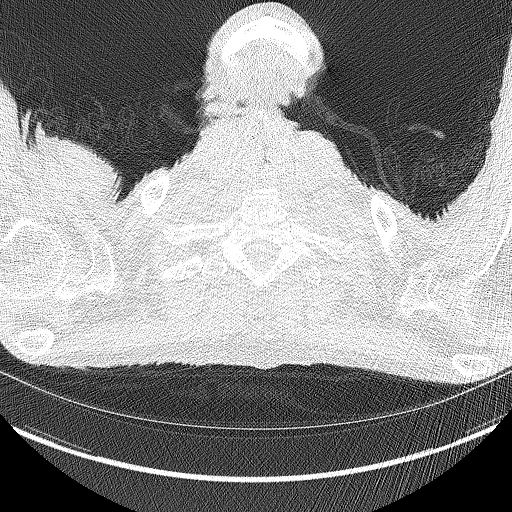

[Series 5: coronals lung 1.00 cor · coronal · 0.63mm/px · 3 of 321 slices shown]
[im 65/321  lung]
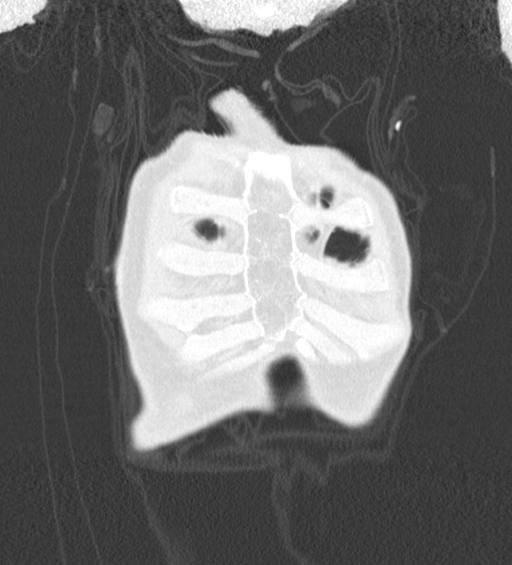
[im 129/321  lung]
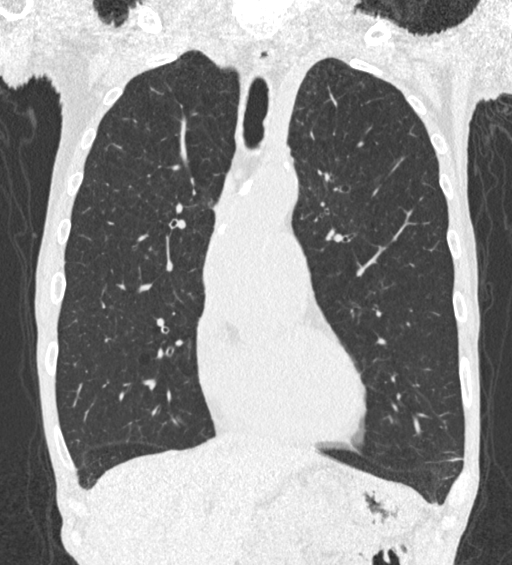
[im 193/321  lung]
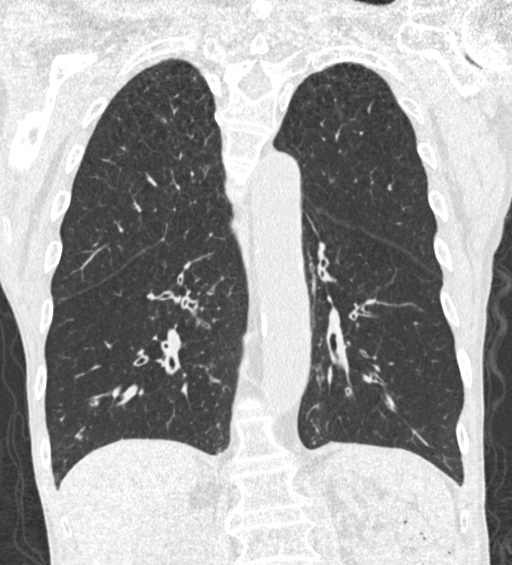

[15 of 40 positions shown; findings below may reference images not displayed]

FINDINGS: Cardiovascular: Aortic atherosclerosis. Normal heart size, without
pericardial effusion. Three vessel coronary artery calcification.

Mediastinum/Nodes: No mediastinal or definite hilar adenopathy,
given limitations of unenhanced CT.

Lungs/Pleura: No pleural fluid. Moderate to marked centrilobular
emphysema. Lower lobe predominant bronchial wall thickening. Areas
of mucoid impaction involve the right lower lobe and are
chronic.Fluid within the trachea and bronchus intermedius.

Right apical scarring is unchanged. Calcified and noncalcified
bilateral pulmonary nodules are similar. The largest noncalcified
nodule measures volume derived equivalent diameter 5.9 mm. Bibasilar
scarring.

No new or enlarging suspicious pulmonary nodule.

Upper Abdomen: Normal imaged portions of the liver, spleen, stomach,
pancreas, adrenal glands. Left renal atrophy. Similar prominence of
the right renal pelvis.

Musculoskeletal: Osteopenia. Mild wedging of the T11 superior
endplate is unchanged.
IMPRESSION: 1. Lung-RADS 2, benign appearance or behavior. Continue annual
screening with low-dose chest CT without contrast in 12 months.
2. Aortic Atherosclerosis (EMGM3-A45.5) and Emphysema (EMGM3-NYE.8).
Coronary artery atherosclerosis.
3. Fluid in the endobronchial tree with bronchial wall thickening,
mucoid impaction. Recommend clinical exclusion of aspiration.
4. Left renal atrophy. Similar prominence of the right renal pelvis
which could be physiologic. If any symptoms to suggest right-sided
hydronephrosis, consider renal ultrasound.

## 2024-01-14 ENCOUNTER — Other Ambulatory Visit: Payer: Self-pay

## 2024-01-14 DIAGNOSIS — F17210 Nicotine dependence, cigarettes, uncomplicated: Secondary | ICD-10-CM

## 2024-01-14 DIAGNOSIS — Z87891 Personal history of nicotine dependence: Secondary | ICD-10-CM

## 2024-01-14 DIAGNOSIS — Z122 Encounter for screening for malignant neoplasm of respiratory organs: Secondary | ICD-10-CM
